# Patient Record
Sex: Male | Born: 1975 | Race: White | Hispanic: No | Marital: Married | Smoking: Current every day smoker
Health system: Southern US, Community
[De-identification: ages and names within clinical notes are randomized; demographics above are authoritative.]

---

## 2014-07-21 ENCOUNTER — Encounter (HOSPITAL_COMMUNITY): Payer: Self-pay | Admitting: Emergency Medicine

## 2014-07-21 ENCOUNTER — Emergency Department (HOSPITAL_COMMUNITY): Payer: BC Managed Care – PPO

## 2014-07-21 ENCOUNTER — Emergency Department (HOSPITAL_COMMUNITY)
Admission: EM | Admit: 2014-07-21 | Discharge: 2014-07-21 | Disposition: A | Discharge status: Home / Self Care | Payer: BC Managed Care – PPO | Attending: Emergency Medicine | Admitting: Emergency Medicine

## 2014-07-21 DIAGNOSIS — F17200 Nicotine dependence, unspecified, uncomplicated: Secondary | ICD-10-CM | POA: Insufficient documentation

## 2014-07-21 DIAGNOSIS — M25529 Pain in unspecified elbow: Secondary | ICD-10-CM | POA: Insufficient documentation

## 2014-07-21 DIAGNOSIS — M7020 Olecranon bursitis, unspecified elbow: Secondary | ICD-10-CM | POA: Insufficient documentation

## 2014-07-21 DIAGNOSIS — M7021 Olecranon bursitis, right elbow: Secondary | ICD-10-CM

## 2014-07-21 LAB — COMPREHENSIVE METABOLIC PANEL
ALT: 13 U/L (ref 0–53)
ANION GAP: 13 (ref 5–15)
AST: 12 U/L (ref 0–37)
Albumin: 3.6 g/dL (ref 3.5–5.2)
Alkaline Phosphatase: 56 U/L (ref 39–117)
BILIRUBIN TOTAL: 0.2 mg/dL — AB (ref 0.3–1.2)
BUN: 12 mg/dL (ref 6–23)
CHLORIDE: 105 meq/L (ref 96–112)
CO2: 24 mEq/L (ref 19–32)
CREATININE: 0.95 mg/dL (ref 0.50–1.35)
Calcium: 9.3 mg/dL (ref 8.4–10.5)
GFR calc Af Amer: >90 mL/min (ref >90)
GFR calc non Af Amer: >90 mL/min (ref >90)
Glucose, Bld: 101 mg/dL — ABNORMAL HIGH (ref 70–99)
Potassium: 4.1 mEq/L (ref 3.7–5.3)
Sodium: 142 mEq/L (ref 137–147)
Total Protein: 6.6 g/dL (ref 6.0–8.3)

## 2014-07-21 LAB — CBC WITH DIFFERENTIAL/PLATELET
Basophils Absolute: 0 10*3/uL (ref 0.0–0.1)
Basophils Relative: 0 % (ref 0–1)
Eosinophils Absolute: 0.3 10*3/uL (ref 0.0–0.7)
Eosinophils Relative: 2 % (ref 0–5)
HEMATOCRIT: 40.8 % (ref 39.0–52.0)
HEMOGLOBIN: 14.5 g/dL (ref 13.0–17.0)
LYMPHS PCT: 21 % (ref 12–46)
Lymphs Abs: 2.7 10*3/uL (ref 0.7–4.0)
MCH: 33 pg (ref 26.0–34.0)
MCHC: 35.5 g/dL (ref 30.0–36.0)
MCV: 92.7 fL (ref 78.0–100.0)
MONO ABS: 1.1 10*3/uL — AB (ref 0.1–1.0)
MONOS PCT: 8 % (ref 3–12)
NEUTROS ABS: 9 10*3/uL — AB (ref 1.7–7.7)
Neutrophils Relative %: 69 % (ref 43–77)
Platelets: 233 10*3/uL (ref 150–400)
RBC: 4.4 MIL/uL (ref 4.22–5.81)
RDW: 12.9 % (ref 11.5–15.5)
WBC: 13 10*3/uL — AB (ref 4.0–10.5)

## 2014-07-21 MED ORDER — DOXYCYCLINE HYCLATE 100 MG PO TABS
100.0000 mg | ORAL_TABLET | Freq: Once | ORAL | Status: AC
  Administered 2014-07-21: 100 mg via ORAL
  Filled 2014-07-21: qty 1

## 2014-07-21 MED ORDER — DOXYCYCLINE HYCLATE 100 MG PO CAPS: 100.0000 mg | ORAL_CAPSULE | Freq: Two times a day (BID) | ORAL | Status: AC

## 2014-07-21 MED ORDER — ONDANSETRON 4 MG PO TBDP
4.0000 mg | ORAL_TABLET | Freq: Once | ORAL | Status: AC
  Administered 2014-07-21: 4 mg via ORAL
  Filled 2014-07-21: qty 1

## 2014-07-21 MED ORDER — IBUPROFEN 600 MG PO TABS: 600.0000 mg | ORAL_TABLET | Freq: Four times a day (QID) | ORAL | Status: AC | PRN

## 2014-07-21 NOTE — ED Provider Notes (Signed)
Medical screening examination/treatment/procedure(s) were performed by non-physician practitioner and as supervising physician I was immediately available for consultation/collaboration.   EKG Interpretation None        Sumner Kirchman L Orly Quimby, MD 07/21/14 2222 

## 2014-07-21 NOTE — ED Provider Notes (Signed)
CSN: [id number]     Arrival date & time 07/21/14  1724 History   First MD Initiated Contact with Patient 07/21/14 1907     Chief Complaint  Patient presents with  . Joint Swelling  . Elbow Pain     (Consider location/radiation/quality/duration/timing/severity/associated sxs/prior Treatment) HPI  Patient to the ED with elbow pain on the right. He accidentally scratched it on the wall earlier this week. He now reports having a swollen right elbow, redness ,tenderness to the touch and with ROM. Otherwise he has no complaints and feels well. No fevers, diaphoresis, nausea, vomiting, diarrhea or weakness. No autoimmune disease  History reviewed. No pertinent past medical history. History reviewed. No pertinent past surgical history. No family history on file. History  Substance Use Topics  . Smoking status: Current Every Day Smoker  . Smokeless tobacco: Not on file  . Alcohol Use: Yes    Review of Systems   Review of Systems  Gen: no weight loss, fevers, chills, night sweats  Eyes: no occular draining, occular pain,  No visual changes  Nose: no epistaxis or rhinorrhea  Mouth: no dental pain, no sore throat  Neck: no neck pain  Lungs: No hemoptysis. No wheezing or coughing CV:  No palpitations, dependent edema or orthopnea. No chest pain Abd: no diarrhea. No nausea or vomiting, No abdominal pain  GU: no dysuria or gross hematuria  MSK:  No muscle weakness,+  Right elbow pain Neuro: no headache, no focal neurologic deficits  Skin: no rash , no wounds Psyche: no complaints of depression or anxiety    Allergies  Review of patient's allergies indicates no known allergies.  Home Medications   Prior to Admission medications   Not on File   BP 116/64  Pulse 80  Temp(Src) 98.4 F (36.9 C) (Oral)  Resp 20  SpO2 97% Physical Exam  Nursing note and vitals reviewed. Constitutional: He appears well-developed and well-nourished. No distress.  HENT:  Head: Normocephalic and  atraumatic.  Eyes: Pupils are equal, round, and reactive to light.  Neck: Normal range of motion. Neck supple.  Cardiovascular: Normal rate and regular rhythm.   Pulmonary/Chest: Effort normal.  Abdominal: Soft.  Musculoskeletal:  FROM but pain with full extension of elbow joint. Mild effusion associated. Induration and erythema overlying joint. Strong radial pulse. Mild swelling.  Neurological: He is alert.  Skin: Skin is warm and dry.    ED Course  Procedures (including critical care time) Labs Review Labs Reviewed  CBC WITH DIFFERENTIAL - Abnormal; Notable for the following:    WBC 13.0 (*)    Neutro Abs 9.0 (*)    Monocytes Absolute 1.1 (*)    All other components within normal limits  COMPREHENSIVE METABOLIC PANEL - Abnormal; Notable for the following:    Glucose, Bld 101 (*)    Total Bilirubin 0.2 (*)    All other components within normal limits    Imaging Review Dg Elbow Complete Right  07/21/2014   CLINICAL DATA:  Right elbow injury and pain.  EXAM: RIGHT ELBOW - COMPLETE 3+ VIEW  COMPARISON:  None.  FINDINGS: There is no evidence of acute fracture, subluxation or dislocation.  No focal bony abnormalities are noted.  There is no evidence of joint effusion.  There may be a posterior soft tissue/ olecranon bursa swelling.  IMPRESSION: No evidence of bony abnormality or joint effusion.  Question posterior soft tissue/olecranon bursa swelling which may represent olecranon bursitis.   Electronically Signed   By: Laveda Abbe  M.D.   On: 07/21/2014 17:59     EKG Interpretation None      MDM   Final diagnoses:  Olecranon bursitis of right elbow    Patient seen by Dr. Estell Harpin as well who feels that NSAIDs and abx are the appropriate course of treatment. Joint does not appear consistent with septic joint. No systemic symptoms and the patient looks well. No fracture on xray. Mildly elevated WBC at 13.0, high neutorphils. Otherwise lab work unremarkable. Xray is consistent with  physical exam findings, olecranon bursitis.  Needs very close follow-up with Ortho- pt made aware of this.  38 y.o.Collin Lewis's evaluation in the Emergency Department is complete. It has been determined that no acute conditions requiring further emergency intervention are present at this time. The patient/guardian have been advised of the diagnosis and plan. We have discussed signs and symptoms that warrant return to the ED, such as changes or worsening in symptoms.  Vital signs are stable at discharge. Filed Vitals:   07/21/14 1728  BP: 116/64  Pulse: 80  Temp: 98.4 F (36.9 C)  Resp: 20    Patient/guardian has voiced understanding and agreed to follow-up with the PCP or specialist.     Dorthula Matas, PA-C 07/21/14 1956

## 2014-07-21 NOTE — ED Notes (Signed)
Pt states he scrapped elbow earlier this week and last night elbow started throbbing and swelling. Pt present with swelling to right elbow and redness.

## 2014-07-21 NOTE — ED Notes (Signed)
He states he "scraped my (right) elbow while digging a hole in my yard Thursday and now my elbow is swollen and red".  He denies fever, nor any sign of current illness and is in no distress.

## 2014-07-21 NOTE — Discharge Instructions (Signed)
Olecranon Bursitis Bursitis is swelling and soreness (inflammation) of a fluid-filled sac (bursa) that covers and protects a joint. Olecranon bursitis occurs over the elbow.  CAUSES Bursitis can be caused by injury, overuse of the joint, arthritis, or infection.  SYMPTOMS   Tenderness, swelling, warmth, or redness over the elbow.  Elbow pain with movement. This is greater with bending the elbow.  Squeaking sound when the bursa is rubbed or moved.  Increasing size of the bursa without pain or discomfort.  Fever with increasing pain and swelling if the bursa becomes infected. HOME CARE INSTRUCTIONS   Put ice on the affected area.  Put ice in a plastic bag.  Place a towel between your skin and the bag.  Leave the ice on for 15-20 minutes each hour while awake. Do this for the first 2 days.  When resting, elevate your elbow above the level of your heart. This helps reduce swelling.  Continue to put the joint through a full range of motion 4 times per day. Rest the injured joint at other times. When the pain lessens, begin normal slow movements and usual activities.  Only take over-the-counter or prescription medicines for pain, discomfort, or fever as directed by your caregiver.  Reduce your intake of milk and related dairy products (cheese, yogurt). They may make your condition worse. SEEK IMMEDIATE MEDICAL CARE IF:   Your pain increases even during treatment.  You have a fever.  You have heat and inflammation over the bursa and elbow.  You have a red line that goes up your arm.  You have pain with movement of your elbow. MAKE SURE YOU:   Understand these instructions.  Will watch your condition.  Will get help right away if you are not doing well or get worse. Document Released: 12/08/2006 Document Revised: 01/31/2012 Document Reviewed: 10/24/2007 [employer] Patient Information 2015 [employer]. This information is not intended to replace advice given to you by your  health care provider. Make sure you discuss any questions you have with your health care provider.  

## 2015-09-09 IMAGING — CR DG ELBOW COMPLETE 3+V*R*
4 series · 4 of 4 positions shown · non-contrast
Comparison: None.

CLINICAL DATA: Right elbow injury and pain.

EXAM:
RIGHT ELBOW - COMPLETE 3+ VIEW

[x elbow ap right]
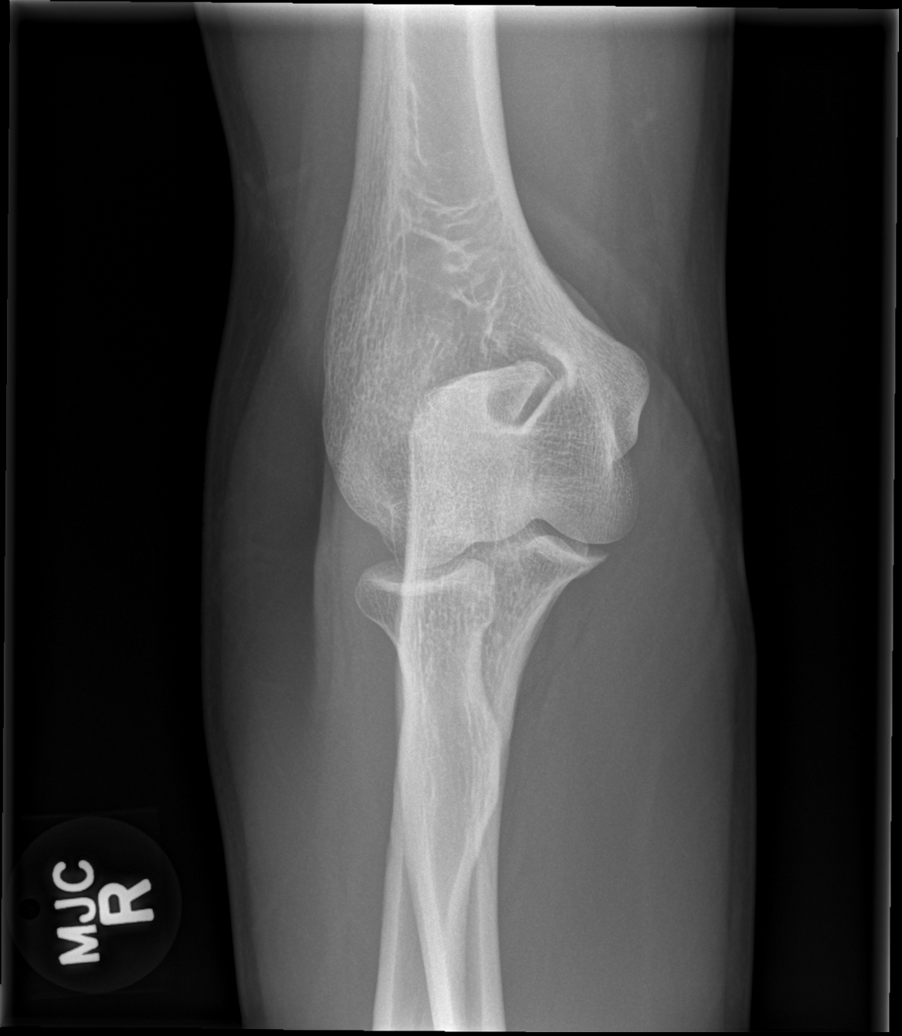

[x elbow obl right (1 of 2)]
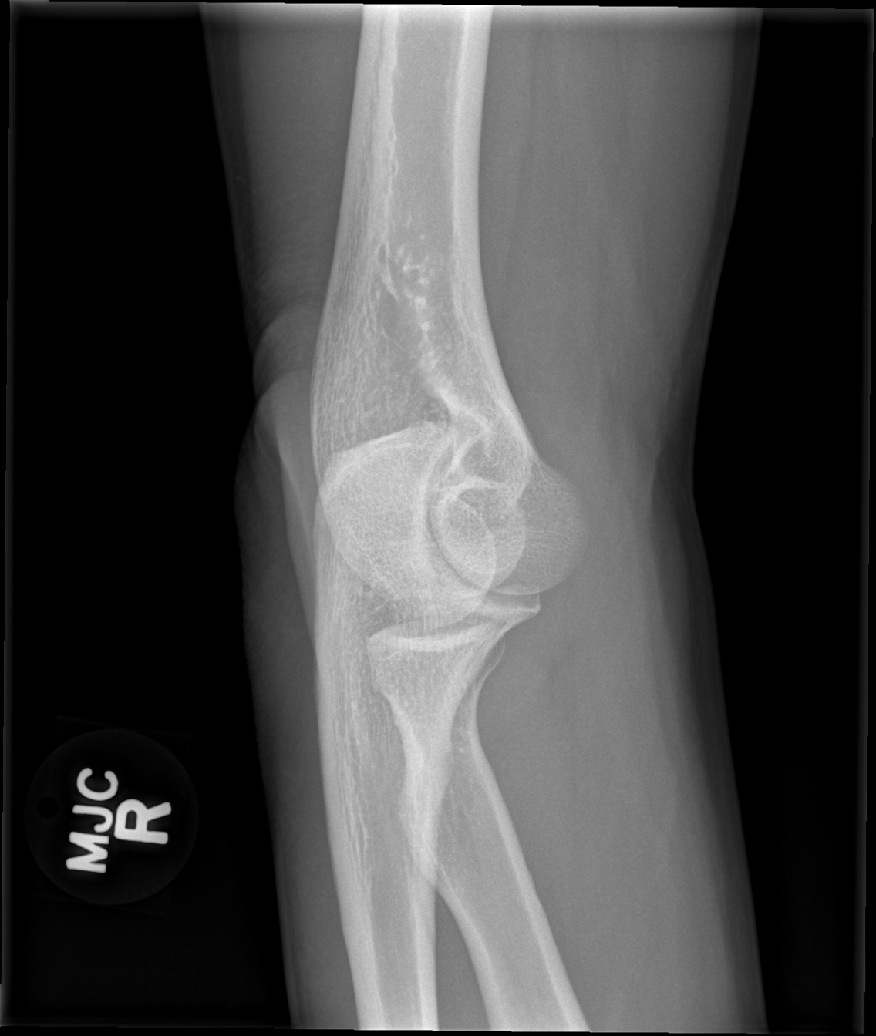

[x elbow obl right (2 of 2)]
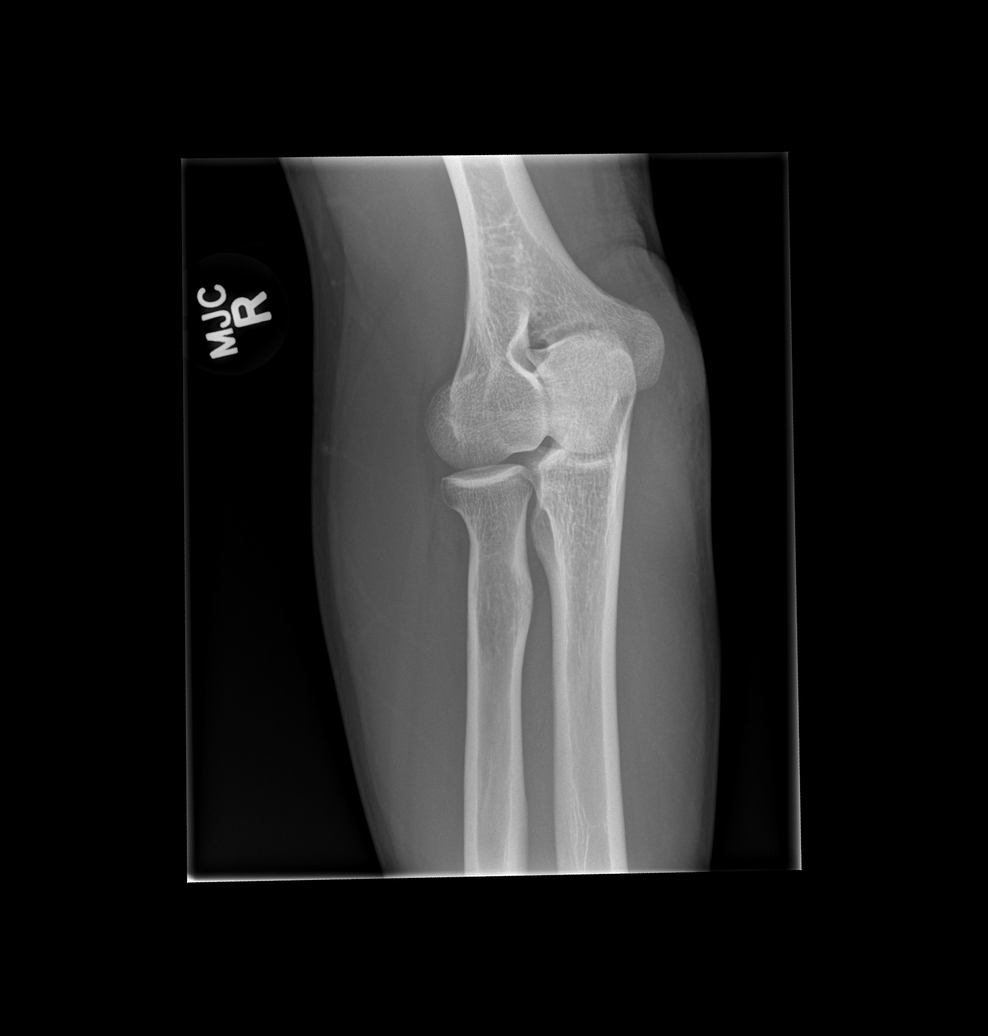

[x elbow lat right]
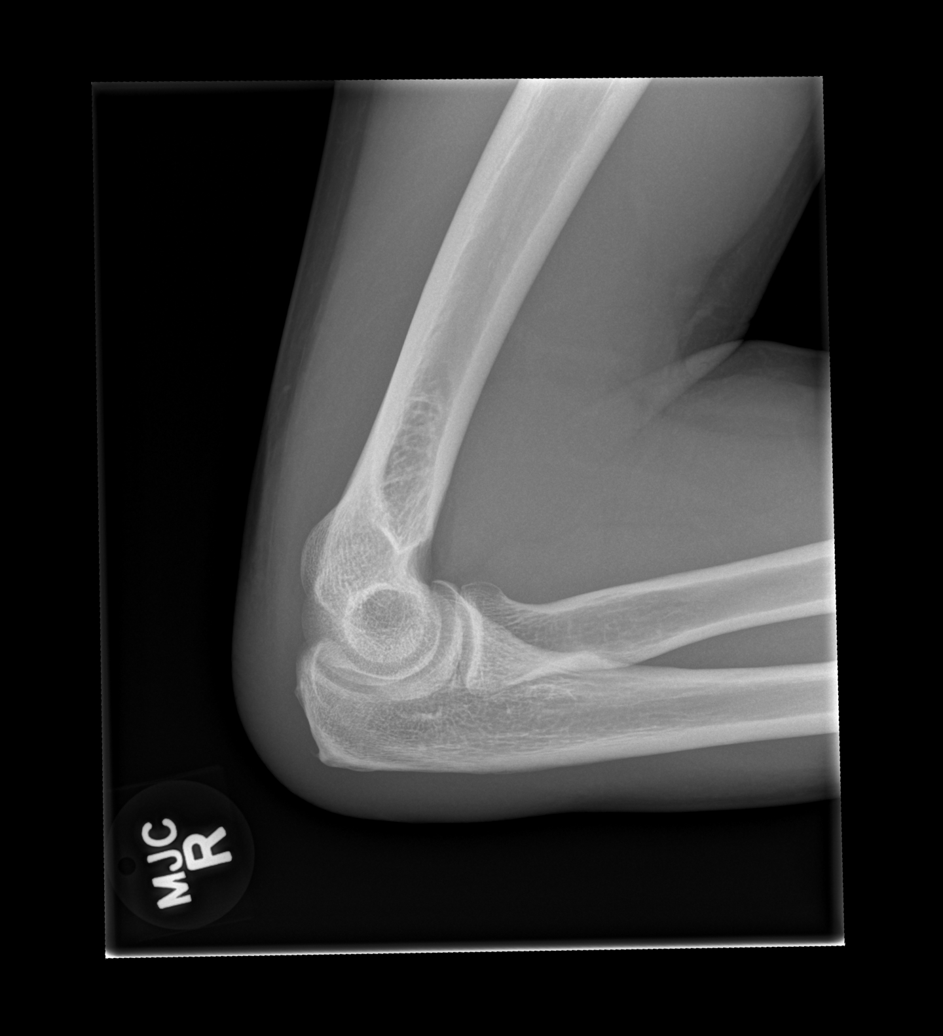

[4 of 4 positions shown; findings below may reference images not displayed]

FINDINGS: There is no evidence of acute fracture, subluxation or dislocation.

No focal bony abnormalities are noted.

There is no evidence of joint effusion.

There may be a posterior soft tissue/ olecranon bursa swelling.
IMPRESSION: No evidence of bony abnormality or joint effusion.

Question posterior soft tissue/olecranon bursa swelling which may
represent olecranon bursitis.
